# Patient Record
Sex: Female | Born: 1990 | Race: White | Hispanic: No | Marital: Single | Smoking: Current every day smoker
Health system: Southern US, Community
[De-identification: ages and names within clinical notes are randomized; demographics above are authoritative.]

## PROBLEM LIST (undated history)

## (undated) DIAGNOSIS — F419 Anxiety disorder, unspecified: Secondary | ICD-10-CM

## (undated) DIAGNOSIS — F329 Major depressive disorder, single episode, unspecified: Secondary | ICD-10-CM

## (undated) DIAGNOSIS — F32A Depression, unspecified: Secondary | ICD-10-CM

## (undated) DIAGNOSIS — F319 Bipolar disorder, unspecified: Secondary | ICD-10-CM

## (undated) DIAGNOSIS — K5090 Crohn's disease, unspecified, without complications: Secondary | ICD-10-CM

---

## 2013-02-28 ENCOUNTER — Encounter (HOSPITAL_COMMUNITY): Payer: Self-pay | Admitting: Emergency Medicine

## 2013-02-28 ENCOUNTER — Inpatient Hospital Stay (HOSPITAL_COMMUNITY)
Admission: EM | Admit: 2013-02-28 | Discharge: 2013-03-05 | DRG: 580 | Disposition: A | Discharge status: Other Acute Inpatient Hospital | Payer: PRIVATE HEALTH INSURANCE | Attending: General Surgery | Admitting: General Surgery

## 2013-02-28 ENCOUNTER — Emergency Department (HOSPITAL_COMMUNITY): Payer: PRIVATE HEALTH INSURANCE

## 2013-02-28 DIAGNOSIS — X789XXA Intentional self-harm by unspecified sharp object, initial encounter: Secondary | ICD-10-CM | POA: Diagnosis present

## 2013-02-28 DIAGNOSIS — F1210 Cannabis abuse, uncomplicated: Secondary | ICD-10-CM | POA: Diagnosis present

## 2013-02-28 DIAGNOSIS — Y849 Medical procedure, unspecified as the cause of abnormal reaction of the patient, or of later complication, without mention of misadventure at the time of the procedure: Secondary | ICD-10-CM | POA: Diagnosis not present

## 2013-02-28 DIAGNOSIS — K560 Paralytic ileus: Secondary | ICD-10-CM | POA: Diagnosis not present

## 2013-02-28 DIAGNOSIS — R339 Retention of urine, unspecified: Secondary | ICD-10-CM | POA: Diagnosis not present

## 2013-02-28 DIAGNOSIS — F332 Major depressive disorder, recurrent severe without psychotic features: Secondary | ICD-10-CM | POA: Diagnosis present

## 2013-02-28 DIAGNOSIS — S31109A Unspecified open wound of abdominal wall, unspecified quadrant without penetration into peritoneal cavity, initial encounter: Principal | ICD-10-CM | POA: Diagnosis present

## 2013-02-28 DIAGNOSIS — F17200 Nicotine dependence, unspecified, uncomplicated: Secondary | ICD-10-CM | POA: Diagnosis present

## 2013-02-28 DIAGNOSIS — F319 Bipolar disorder, unspecified: Secondary | ICD-10-CM

## 2013-02-28 DIAGNOSIS — F411 Generalized anxiety disorder: Secondary | ICD-10-CM | POA: Diagnosis present

## 2013-02-28 DIAGNOSIS — N390 Urinary tract infection, site not specified: Secondary | ICD-10-CM | POA: Diagnosis not present

## 2013-02-28 DIAGNOSIS — S31119A Laceration without foreign body of abdominal wall, unspecified quadrant without penetration into peritoneal cavity, initial encounter: Secondary | ICD-10-CM

## 2013-02-28 DIAGNOSIS — K5090 Crohn's disease, unspecified, without complications: Secondary | ICD-10-CM | POA: Diagnosis present

## 2013-02-28 DIAGNOSIS — Z7289 Other problems related to lifestyle: Secondary | ICD-10-CM

## 2013-02-28 DIAGNOSIS — D62 Acute posthemorrhagic anemia: Secondary | ICD-10-CM | POA: Diagnosis not present

## 2013-02-28 DIAGNOSIS — K929 Disease of digestive system, unspecified: Secondary | ICD-10-CM | POA: Diagnosis not present

## 2013-02-28 DIAGNOSIS — IMO0002 Reserved for concepts with insufficient information to code with codable children: Secondary | ICD-10-CM

## 2013-02-28 DIAGNOSIS — K469 Unspecified abdominal hernia without obstruction or gangrene: Secondary | ICD-10-CM | POA: Diagnosis present

## 2013-02-28 DIAGNOSIS — R45851 Suicidal ideations: Secondary | ICD-10-CM

## 2013-02-28 HISTORY — DX: Bipolar disorder, unspecified: F31.9

## 2013-02-28 HISTORY — DX: Crohn's disease, unspecified, without complications: K50.90

## 2013-02-28 HISTORY — DX: Depression, unspecified: F32.A

## 2013-02-28 HISTORY — DX: Anxiety disorder, unspecified: F41.9

## 2013-02-28 HISTORY — DX: Major depressive disorder, single episode, unspecified: F32.9

## 2013-02-28 MED ORDER — ONDANSETRON HCL 4 MG/2ML IJ SOLN
INTRAMUSCULAR | Status: AC
  Filled 2013-02-28: qty 2

## 2013-02-28 MED ORDER — ROCURONIUM BROMIDE 50 MG/5ML IV SOLN
INTRAVENOUS | Status: AC
  Filled 2013-02-28: qty 1

## 2013-02-28 MED ORDER — GLYCOPYRROLATE 0.2 MG/ML IJ SOLN
INTRAMUSCULAR | Status: AC
Start: 2013-02-28 — End: 2013-02-28
  Filled 2013-02-28: qty 2

## 2013-02-28 MED ORDER — PROPOFOL 10 MG/ML IV BOLUS
INTRAVENOUS | Status: AC
  Filled 2013-02-28: qty 20

## 2013-02-28 MED ORDER — PHENYLEPHRINE 40 MCG/ML (10ML) SYRINGE FOR IV PUSH (FOR BLOOD PRESSURE SUPPORT)
PREFILLED_SYRINGE | INTRAVENOUS | Status: AC
  Filled 2013-02-28: qty 10

## 2013-02-28 MED ORDER — LIDOCAINE HCL (CARDIAC) 20 MG/ML IV SOLN
INTRAVENOUS | Status: AC
  Filled 2013-02-28: qty 5

## 2013-02-28 MED ORDER — MIDAZOLAM HCL 2 MG/2ML IJ SOLN
INTRAMUSCULAR | Status: AC
  Filled 2013-02-28: qty 2

## 2013-02-28 MED ORDER — CEFAZOLIN SODIUM-DEXTROSE 2-3 GM-% IV SOLR
INTRAVENOUS | Status: AC
  Filled 2013-02-28: qty 50

## 2013-02-28 MED ORDER — CEFAZOLIN SODIUM-DEXTROSE 2-3 GM-% IV SOLR: 2.0000 g | Freq: Once | INTRAVENOUS | Status: DC

## 2013-02-28 MED ORDER — TETANUS-DIPHTHERIA TOXOIDS TD 5-2 LFU IM INJ
0.5000 mL | INJECTION | Freq: Once | INTRAMUSCULAR | Status: AC
  Administered 2013-02-28: 0.5 mL via INTRAMUSCULAR

## 2013-02-28 MED ORDER — FENTANYL CITRATE 0.05 MG/ML IJ SOLN
INTRAMUSCULAR | Status: AC
  Filled 2013-02-28: qty 5

## 2013-02-28 MED ORDER — SUCCINYLCHOLINE CHLORIDE 20 MG/ML IJ SOLN
INTRAMUSCULAR | Status: AC
Start: 2013-02-28 — End: 2013-02-28
  Filled 2013-02-28: qty 1

## 2013-02-28 MED ORDER — NEOSTIGMINE METHYLSULFATE 1 MG/ML IJ SOLN
INTRAMUSCULAR | Status: AC
  Filled 2013-02-28: qty 10

## 2013-02-28 MED ORDER — PROPOFOL 10 MG/ML IV BOLUS
INTRAVENOUS | Status: AC
Start: 2013-02-28 — End: 2013-02-28
  Filled 2013-02-28: qty 20

## 2013-02-28 NOTE — ED Notes (Signed)
See Trauma Narrator

## 2013-03-01 ENCOUNTER — Emergency Department (HOSPITAL_COMMUNITY): Payer: PRIVATE HEALTH INSURANCE | Admitting: Anesthesiology

## 2013-03-01 ENCOUNTER — Encounter (HOSPITAL_COMMUNITY): Payer: Self-pay | Admitting: Emergency Medicine

## 2013-03-01 ENCOUNTER — Encounter (HOSPITAL_COMMUNITY)
Admission: EM | Disposition: A | Discharge status: Other Acute Inpatient Hospital | Payer: Self-pay | Source: Home / Self Care

## 2013-03-01 ENCOUNTER — Encounter (HOSPITAL_COMMUNITY): Payer: PRIVATE HEALTH INSURANCE | Admitting: Anesthesiology

## 2013-03-01 DIAGNOSIS — R45851 Suicidal ideations: Secondary | ICD-10-CM

## 2013-03-01 DIAGNOSIS — F332 Major depressive disorder, recurrent severe without psychotic features: Secondary | ICD-10-CM

## 2013-03-01 DIAGNOSIS — S31109A Unspecified open wound of abdominal wall, unspecified quadrant without penetration into peritoneal cavity, initial encounter: Secondary | ICD-10-CM

## 2013-03-01 DIAGNOSIS — F319 Bipolar disorder, unspecified: Secondary | ICD-10-CM | POA: Diagnosis present

## 2013-03-01 DIAGNOSIS — D62 Acute posthemorrhagic anemia: Secondary | ICD-10-CM | POA: Diagnosis not present

## 2013-03-01 DIAGNOSIS — F3130 Bipolar disorder, current episode depressed, mild or moderate severity, unspecified: Secondary | ICD-10-CM

## 2013-03-01 DIAGNOSIS — K5090 Crohn's disease, unspecified, without complications: Secondary | ICD-10-CM | POA: Insufficient documentation

## 2013-03-01 DIAGNOSIS — S3981XA Other specified injuries of abdomen, initial encounter: Secondary | ICD-10-CM

## 2013-03-01 DIAGNOSIS — S31119A Laceration without foreign body of abdominal wall, unspecified quadrant without penetration into peritoneal cavity, initial encounter: Secondary | ICD-10-CM | POA: Diagnosis present

## 2013-03-01 HISTORY — PX: LAPAROTOMY: SHX154

## 2013-03-01 LAB — URINE MICROSCOPIC-ADD ON

## 2013-03-01 LAB — URINALYSIS, ROUTINE W REFLEX MICROSCOPIC
Bilirubin Urine: NEGATIVE
GLUCOSE, UA: NEGATIVE mg/dL
Ketones, ur: NEGATIVE mg/dL
LEUKOCYTES UA: NEGATIVE
Nitrite: NEGATIVE
Protein, ur: NEGATIVE mg/dL
Specific Gravity, Urine: 1.029 (ref 1.005–1.030)
Urobilinogen, UA: 0.2 mg/dL (ref 0.0–1.0)
pH: 5.5 (ref 5.0–8.0)

## 2013-03-01 LAB — BASIC METABOLIC PANEL
BUN: 5 mg/dL — ABNORMAL LOW (ref 6–23)
CO2: 22 meq/L (ref 19–32)
Calcium: 8.4 mg/dL (ref 8.4–10.5)
Chloride: 104 mEq/L (ref 96–112)
Creatinine, Ser: 0.72 mg/dL (ref 0.50–1.10)
GFR calc Af Amer: >90 mL/min (ref >90)
GFR calc non Af Amer: >90 mL/min (ref >90)
Glucose, Bld: 115 mg/dL — ABNORMAL HIGH (ref 70–99)
POTASSIUM: 3.8 meq/L (ref 3.7–5.3)
SODIUM: 139 meq/L (ref 137–147)

## 2013-03-01 LAB — COMPREHENSIVE METABOLIC PANEL
ALT: 15 U/L (ref 0–35)
AST: 23 U/L (ref 0–37)
Albumin: 4.3 g/dL (ref 3.5–5.2)
Alkaline Phosphatase: 75 U/L (ref 39–117)
BUN: 4 mg/dL — ABNORMAL LOW (ref 6–23)
CALCIUM: 9.4 mg/dL (ref 8.4–10.5)
CO2: 17 mEq/L — ABNORMAL LOW (ref 19–32)
Chloride: 103 mEq/L (ref 96–112)
Creatinine, Ser: 0.79 mg/dL (ref 0.50–1.10)
GFR calc non Af Amer: >90 mL/min (ref >90)
GLUCOSE: 99 mg/dL (ref 70–99)
Potassium: 3.8 mEq/L (ref 3.7–5.3)
SODIUM: 142 meq/L (ref 137–147)
Total Bilirubin: 0.2 mg/dL — ABNORMAL LOW (ref 0.3–1.2)
Total Protein: 8.1 g/dL (ref 6.0–8.3)

## 2013-03-01 LAB — CBC
HCT: 34.4 % — ABNORMAL LOW (ref 36.0–46.0)
HEMATOCRIT: 42.4 % (ref 36.0–46.0)
Hemoglobin: 11.4 g/dL — ABNORMAL LOW (ref 12.0–15.0)
Hemoglobin: 13.9 g/dL (ref 12.0–15.0)
MCH: 25.9 pg — ABNORMAL LOW (ref 26.0–34.0)
MCH: 26 pg (ref 26.0–34.0)
MCHC: 32.8 g/dL (ref 30.0–36.0)
MCHC: 33.1 g/dL (ref 30.0–36.0)
MCV: 78.4 fL (ref 78.0–100.0)
MCV: 79.1 fL (ref 78.0–100.0)
PLATELETS: 443 10*3/uL — AB (ref 150–400)
Platelets: 570 10*3/uL — ABNORMAL HIGH (ref 150–400)
RBC: 4.39 MIL/uL (ref 3.87–5.11)
RBC: 5.36 MIL/uL — ABNORMAL HIGH (ref 3.87–5.11)
RDW: 15.7 % — ABNORMAL HIGH (ref 11.5–15.5)
RDW: 15.8 % — ABNORMAL HIGH (ref 11.5–15.5)
WBC: 13.4 10*3/uL — AB (ref 4.0–10.5)
WBC: 16.9 10*3/uL — AB (ref 4.0–10.5)

## 2013-03-01 LAB — RAPID URINE DRUG SCREEN, HOSP PERFORMED
AMPHETAMINES: NOT DETECTED
Amphetamines: NOT DETECTED
BARBITURATES: NOT DETECTED
BENZODIAZEPINES: NOT DETECTED
Barbiturates: NOT DETECTED
Benzodiazepines: POSITIVE — AB
COCAINE: NOT DETECTED
Cocaine: NOT DETECTED
Opiates: NOT DETECTED
Opiates: POSITIVE — AB
Tetrahydrocannabinol: NOT DETECTED
Tetrahydrocannabinol: NOT DETECTED

## 2013-03-01 LAB — I-STAT CHEM 8, ED
Calcium, Ion: 1.2 mmol/L (ref 1.12–1.23)
Chloride: 105 mEq/L (ref 96–112)
Creatinine, Ser: 1 mg/dL (ref 0.50–1.10)
Glucose, Bld: 104 mg/dL — ABNORMAL HIGH (ref 70–99)
HCT: 47 % — ABNORMAL HIGH (ref 36.0–46.0)
Hemoglobin: 16 g/dL — ABNORMAL HIGH (ref 12.0–15.0)
POTASSIUM: 3.5 meq/L — AB (ref 3.7–5.3)
Sodium: 141 mEq/L (ref 137–147)
TCO2: 19 mmol/L (ref 0–100)

## 2013-03-01 LAB — TYPE AND SCREEN
ABO/RH(D): O POS
ANTIBODY SCREEN: NEGATIVE
UNIT DIVISION: 0
UNIT DIVISION: 0

## 2013-03-01 LAB — PREPARE FRESH FROZEN PLASMA: Unit division: 0

## 2013-03-01 LAB — ABO/RH: ABO/RH(D): O POS

## 2013-03-01 LAB — BLOOD PRODUCT ORDER (VERBAL) VERIFICATION

## 2013-03-01 LAB — PROTIME-INR
INR: 0.95 (ref 0.00–1.49)
Prothrombin Time: 12.5 seconds (ref 11.6–15.2)

## 2013-03-01 LAB — I-STAT CG4 LACTIC ACID, ED: LACTIC ACID, VENOUS: 4.51 mmol/L — AB (ref 0.5–2.2)

## 2013-03-01 LAB — PREGNANCY, URINE
Preg Test, Ur: NEGATIVE
Preg Test, Ur: NEGATIVE

## 2013-03-01 SURGERY — LAPAROTOMY, EXPLORATORY
Anesthesia: General | Site: Abdomen

## 2013-03-01 MED ORDER — OXYCODONE HCL 5 MG PO TABS: 5.0000 mg | ORAL_TABLET | Freq: Once | ORAL | Status: DC | PRN

## 2013-03-01 MED ORDER — POLYETHYLENE GLYCOL 3350 17 G PO PACK
17.0000 g | PACK | Freq: Every day | ORAL | Status: DC
  Administered 2013-03-02 – 2013-03-05 (×4): 17 g via ORAL
  Filled 2013-03-01 (×5): qty 1

## 2013-03-01 MED ORDER — HYDROMORPHONE HCL PF 1 MG/ML IJ SOLN
INTRAMUSCULAR | Status: AC
  Filled 2013-03-01: qty 1

## 2013-03-01 MED ORDER — ONDANSETRON HCL 4 MG/2ML IJ SOLN: 4.0000 mg | Freq: Four times a day (QID) | INTRAMUSCULAR | Status: DC | PRN

## 2013-03-01 MED ORDER — 0.9 % SODIUM CHLORIDE (POUR BTL) OPTIME
TOPICAL | Status: DC | PRN
  Administered 2013-03-01: 2000 mL

## 2013-03-01 MED ORDER — LACTATED RINGERS IV SOLN
INTRAVENOUS | Status: DC | PRN
  Administered 2013-03-01 (×2): via INTRAVENOUS

## 2013-03-01 MED ORDER — DOCUSATE SODIUM 100 MG PO CAPS
100.0000 mg | ORAL_CAPSULE | Freq: Two times a day (BID) | ORAL | Status: DC
Start: 2013-03-01 — End: 2013-03-05
  Administered 2013-03-01 – 2013-03-05 (×8): 100 mg via ORAL
  Filled 2013-03-01 (×11): qty 1

## 2013-03-01 MED ORDER — MIDAZOLAM HCL 5 MG/5ML IJ SOLN
INTRAMUSCULAR | Status: DC | PRN
  Administered 2013-03-01: 2 mg via INTRAVENOUS

## 2013-03-01 MED ORDER — HYDROMORPHONE 0.3 MG/ML IV SOLN
INTRAVENOUS | Status: DC
  Administered 2013-03-01: 3.9 mg via INTRAVENOUS
  Administered 2013-03-01: 10:00:00 via INTRAVENOUS
  Administered 2013-03-01: 1.4 mg via INTRAVENOUS
  Administered 2013-03-01: 1.5 mg via INTRAVENOUS
  Administered 2013-03-01: 21:00:00 via INTRAVENOUS
  Administered 2013-03-02: 4.97 mg via INTRAVENOUS
  Administered 2013-03-02: 07:00:00 via INTRAVENOUS
  Filled 2013-03-01 (×3): qty 25

## 2013-03-01 MED ORDER — HYDROMORPHONE HCL PF 1 MG/ML IJ SOLN
0.2500 mg | INTRAMUSCULAR | Status: DC | PRN
  Administered 2013-03-01 (×4): 0.5 mg via INTRAVENOUS

## 2013-03-01 MED ORDER — MORPHINE SULFATE (PF) 1 MG/ML IV SOLN
INTRAVENOUS | Status: DC
  Administered 2013-03-01: 28.5 mg via INTRAVENOUS
  Administered 2013-03-01 (×2): via INTRAVENOUS
  Filled 2013-03-01 (×2): qty 25

## 2013-03-01 MED ORDER — ONDANSETRON HCL 4 MG/2ML IJ SOLN
INTRAMUSCULAR | Status: DC | PRN
  Administered 2013-03-01: 4 mg via INTRAVENOUS

## 2013-03-01 MED ORDER — LIDOCAINE HCL (CARDIAC) 20 MG/ML IV SOLN
INTRAVENOUS | Status: DC | PRN
  Administered 2013-03-01: 100 mg via INTRAVENOUS

## 2013-03-01 MED ORDER — HEPARIN SODIUM (PORCINE) 5000 UNIT/ML IJ SOLN
5000.0000 [IU] | Freq: Three times a day (TID) | INTRAMUSCULAR | Status: DC
  Filled 2013-03-01 (×4): qty 1

## 2013-03-01 MED ORDER — PANTOPRAZOLE SODIUM 40 MG PO TBEC
40.0000 mg | DELAYED_RELEASE_TABLET | Freq: Every day | ORAL | Status: DC
  Administered 2013-03-01 – 2013-03-02 (×2): 40 mg via ORAL
  Filled 2013-03-01 (×2): qty 1

## 2013-03-01 MED ORDER — SUCCINYLCHOLINE CHLORIDE 20 MG/ML IJ SOLN
INTRAMUSCULAR | Status: DC | PRN
  Administered 2013-03-01: 140 mg via INTRAVENOUS

## 2013-03-01 MED ORDER — ONDANSETRON HCL 4 MG/2ML IJ SOLN: 4.0000 mg | Freq: Once | INTRAMUSCULAR | Status: DC | PRN

## 2013-03-01 MED ORDER — LACTATED RINGERS IV SOLN
INTRAVENOUS | Status: DC | PRN
  Administered 2013-02-28: 125 mL/h via INTRAVENOUS

## 2013-03-01 MED ORDER — PROPOFOL 10 MG/ML IV BOLUS
INTRAVENOUS | Status: DC | PRN
  Administered 2013-03-01: 150 mg via INTRAVENOUS

## 2013-03-01 MED ORDER — NAPROXEN 500 MG PO TABS
500.0000 mg | ORAL_TABLET | Freq: Two times a day (BID) | ORAL | Status: DC
  Administered 2013-03-01 – 2013-03-04 (×6): 500 mg via ORAL
  Filled 2013-03-01 (×11): qty 1

## 2013-03-01 MED ORDER — KCL IN DEXTROSE-NACL 20-5-0.45 MEQ/L-%-% IV SOLN
INTRAVENOUS | Status: DC
  Administered 2013-03-01 – 2013-03-02 (×2): via INTRAVENOUS
  Filled 2013-03-01 (×3): qty 1000

## 2013-03-01 MED ORDER — ONDANSETRON HCL 4 MG PO TABS: 4.0000 mg | ORAL_TABLET | Freq: Four times a day (QID) | ORAL | Status: DC | PRN

## 2013-03-01 MED ORDER — PANTOPRAZOLE SODIUM 40 MG IV SOLR
40.0000 mg | Freq: Every day | INTRAVENOUS | Status: DC
  Filled 2013-03-01: qty 40

## 2013-03-01 MED ORDER — DIPHENHYDRAMINE HCL 50 MG/ML IJ SOLN
12.5000 mg | Freq: Four times a day (QID) | INTRAMUSCULAR | Status: DC | PRN
  Administered 2013-03-01 – 2013-03-02 (×3): 12.5 mg via INTRAVENOUS
  Filled 2013-03-01 (×5): qty 1

## 2013-03-01 MED ORDER — MEPERIDINE HCL 25 MG/ML IJ SOLN: 6.2500 mg | INTRAMUSCULAR | Status: DC | PRN

## 2013-03-01 MED ORDER — SODIUM CHLORIDE 0.9 % IV SOLN
2.0000 mg | INTRAVENOUS | Status: DC | PRN
  Administered 2013-03-01: 2 mg via INTRAVENOUS

## 2013-03-01 MED ORDER — FENTANYL CITRATE 0.05 MG/ML IJ SOLN
INTRAMUSCULAR | Status: DC | PRN
  Administered 2013-03-01 (×2): 50 ug via INTRAVENOUS
  Administered 2013-03-01: 100 ug via INTRAVENOUS
  Administered 2013-03-01: 50 ug via INTRAVENOUS

## 2013-03-01 MED ORDER — DIPHENHYDRAMINE HCL 12.5 MG/5ML PO ELIX
12.5000 mg | ORAL_SOLUTION | Freq: Four times a day (QID) | ORAL | Status: DC | PRN
  Filled 2013-03-01: qty 5

## 2013-03-01 MED ORDER — ENOXAPARIN SODIUM 40 MG/0.4ML ~~LOC~~ SOLN
40.0000 mg | SUBCUTANEOUS | Status: DC
  Administered 2013-03-03 – 2013-03-05 (×3): 40 mg via SUBCUTANEOUS
  Filled 2013-03-01 (×6): qty 0.4

## 2013-03-01 MED ORDER — OXYCODONE HCL 5 MG/5ML PO SOLN: 5.0000 mg | Freq: Once | ORAL | Status: DC | PRN

## 2013-03-01 MED ORDER — NALOXONE HCL 0.4 MG/ML IJ SOLN: 0.4000 mg | INTRAMUSCULAR | Status: DC | PRN

## 2013-03-01 MED ORDER — SODIUM CHLORIDE 0.9 % IJ SOLN: 9.0000 mL | INTRAMUSCULAR | Status: DC | PRN

## 2013-03-01 SURGICAL SUPPLY — 52 items
BLADE SURG ROTATE 9660 (MISCELLANEOUS) IMPLANT
CANISTER SUCTION 2500CC (MISCELLANEOUS) ×3 IMPLANT
CHLORAPREP W/TINT 26ML (MISCELLANEOUS) ×3 IMPLANT
COVER MAYO STAND STRL (DRAPES) ×3 IMPLANT
COVER SURGICAL LIGHT HANDLE (MISCELLANEOUS) ×3 IMPLANT
DRAPE LAPAROSCOPIC ABDOMINAL (DRAPES) ×3 IMPLANT
DRAPE PROXIMA HALF (DRAPES) IMPLANT
DRAPE UTILITY 15X26 W/TAPE STR (DRAPE) ×6 IMPLANT
DRAPE WARM FLUID 44X44 (DRAPE) ×6 IMPLANT
DRSG COVADERM 4X10 (GAUZE/BANDAGES/DRESSINGS) ×3 IMPLANT
DRSG COVADERM 4X8 (GAUZE/BANDAGES/DRESSINGS) ×3 IMPLANT
DRSG OPSITE POSTOP 4X10 (GAUZE/BANDAGES/DRESSINGS) IMPLANT
DRSG OPSITE POSTOP 4X8 (GAUZE/BANDAGES/DRESSINGS) IMPLANT
ELECT BLADE 6.5 EXT (BLADE) IMPLANT
ELECT CAUTERY BLADE 6.4 (BLADE) ×3 IMPLANT
ELECT REM PT RETURN 9FT ADLT (ELECTROSURGICAL) ×3
ELECTRODE REM PT RTRN 9FT ADLT (ELECTROSURGICAL) ×1 IMPLANT
GLOVE BIO SURGEON STRL SZ 6 (GLOVE) ×6 IMPLANT
GLOVE BIOGEL PI IND STRL 6.5 (GLOVE) ×2 IMPLANT
GLOVE BIOGEL PI IND STRL 7.5 (GLOVE) ×2 IMPLANT
GLOVE BIOGEL PI INDICATOR 6.5 (GLOVE) ×4
GLOVE BIOGEL PI INDICATOR 7.5 (GLOVE) ×4
GLOVE ECLIPSE 7.0 STRL STRAW (GLOVE) ×3 IMPLANT
GLOVE SURG SS PI 7.5 STRL IVOR (GLOVE) ×6 IMPLANT
GOWN STRL NON-REIN LRG LVL3 (GOWN DISPOSABLE) ×6 IMPLANT
GOWN STRL REUS W/TWL 2XL LVL3 (GOWN DISPOSABLE) ×3 IMPLANT
KIT BASIN OR (CUSTOM PROCEDURE TRAY) ×3 IMPLANT
KIT ROOM TURNOVER OR (KITS) ×3 IMPLANT
LIGASURE IMPACT 36 18CM CVD LR (INSTRUMENTS) IMPLANT
NS IRRIG 1000ML POUR BTL (IV SOLUTION) ×6 IMPLANT
PACK GENERAL/GYN (CUSTOM PROCEDURE TRAY) ×3 IMPLANT
PAD ARMBOARD 7.5X6 YLW CONV (MISCELLANEOUS) ×3 IMPLANT
PENCIL BUTTON HOLSTER BLD 10FT (ELECTRODE) IMPLANT
SPECIMEN JAR LARGE (MISCELLANEOUS) IMPLANT
SPONGE LAP 18X18 X RAY DECT (DISPOSABLE) IMPLANT
STAPLER VISISTAT 35W (STAPLE) ×3 IMPLANT
SUCTION POOLE TIP (SUCTIONS) ×3 IMPLANT
SUT PDS AB 1 TP1 96 (SUTURE) ×6 IMPLANT
SUT PDS II 0 TP-1 LOOPED 60 (SUTURE) ×6 IMPLANT
SUT VIC AB 0 CT1 27 (SUTURE) ×2
SUT VIC AB 0 CT1 27XBRD ANBCTR (SUTURE) ×1 IMPLANT
SUT VIC AB 2-0 SH 18 (SUTURE) ×3 IMPLANT
SUT VIC AB 3-0 SH 18 (SUTURE) ×3 IMPLANT
SUT VICRYL 4-0 PS2 18IN ABS (SUTURE) IMPLANT
SUT VICRYL AB 2 0 TIES (SUTURE) ×3 IMPLANT
SUT VICRYL AB 3 0 TIES (SUTURE) ×3 IMPLANT
TOWEL OR 17X24 6PK STRL BLUE (TOWEL DISPOSABLE) ×3 IMPLANT
TOWEL OR 17X26 10 PK STRL BLUE (TOWEL DISPOSABLE) ×3 IMPLANT
TRAY FOLEY CATH 16FRSI W/METER (SET/KITS/TRAYS/PACK) IMPLANT
TUBE CONNECTING 12'X1/4 (SUCTIONS)
TUBE CONNECTING 12X1/4 (SUCTIONS) IMPLANT
YANKAUER SUCT BULB TIP NO VENT (SUCTIONS) IMPLANT
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
## 2013-03-01 NOTE — ED Notes (Signed)
Uneventful transport to Short Stay.
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
## 2013-03-01 NOTE — ED Notes (Signed)
Patient Valuables envelope x 2 in [employer] in the Safe. Valuable envelope numbers are [id number], [id number].  Leretha DykesMelissa Browning, RN and Maryellen PileEd Janaria Mccammon, Charity fundraiserN verified contents along with [employer].

## 2013-03-01 NOTE — Preoperative (Signed)
Beta Blockers   Reason not to administer Beta Blockers:Not Applicable 

## 2013-03-01 NOTE — Progress Notes (Signed)
NURSING PROGRESS NOTE  Isabella Phillips [id number] Admission Data: 03/01/2013 2:55 AM Attending Provider: Trauma Md, MD OTR:[id number] Not In System Code Status: full  Isabella Phillips is a 23 y.o. female patient admitted from ED:  -No acute distress noted.  -No complaints of shortness of breath.  -No complaints of chest pain.    Blood pressure 127/85, pulse 126, temperature 98.2 F (36.8 C), temperature source Oral, resp. rate 22, height 5' 3.5" (1.613 m), weight 58.968 kg (130 lb), SpO2 92.00%.   IV Fluids:  IV in place, occlusive dsg intact without redness, IV cath forearm left, condition patent and no redness lactated Ringer's.   Allergies:  Review of patient's allergies indicates no known allergies.  Past Medical History:   has a past medical history of Bipolar 1 disorder and Crohn's disease.  Past Surgical History:   has no past surgical history on file.  Social History:   reports that she has been smoking.  She does not have any smokeless tobacco history on file. She reports that she drinks alcohol. She reports that she uses illicit drugs (Marijuana).  Skin: WDL  Patient/Family oriented to room. Information packet given to patient/family. Admission inpatient armband information verified with patient/family to include name and date of birth and placed on patient arm. Side rails up x 2, fall assessment and education completed with patient/family. Patient/family able to verbalize understanding of risk associated with falls and verbalized understanding to call for assistance before getting out of bed. Call light within reach. Patient/family able to voice and demonstrate understanding of unit orientation instructions.

## 2013-03-01 NOTE — Progress Notes (Signed)
Pt. Has refused to cough, move in bed or get up and walk in halls.  Pt. Agreed to sit on the side of the bed if she could speak with her fiancee.  RN allowed pt. To talk on the phone with Amber, her fiancee.  Pt. Sat up on side of bed and then wanted to walk.  Pt. Ambulated in hall up to nursing station beside the snack area with oxygen and became dizzy.  She was placed in wheelchair and taken back to room and placed back in bed.  Will continue to monitor.  Forbes Cellarelcine Mika Griffitts, RN
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
## 2013-03-01 NOTE — Progress Notes (Signed)
Pt PCA pump beeping, pt HR in 120-130s while pt is crying about fiance not here yet to see her. RN Educational psychologist in pt room in attempt to calm pt down,  Pt educated on why its needed for her to remain calm, as PCA will not deliver pain meds with her HR elevated. Pt agitated about pump peeping stating that she's hurting and can't stop crying, she states "why isn't Amber here yet?" Pt calms down shortly after, PCA dose given. Pt stated that she doesn't want the PCA pump and that the doctor told her she would be getting pills for her pain. Paged [phone], General surgery pager number. Awaiting callback.

## 2013-03-01 NOTE — Progress Notes (Signed)
Patient ID: Isabella OchoaBrooke Elizabeth Phillips, female   DOB: [birth year], 23 y.o.   MRN: [id number]   LOS: 1 day  POD#0  Subjective: Pain poorly controlled. Denies N/V/flatus.   Objective: Vital signs in last 24 hours: Temp:  [97.6 F (36.4 C)-98.8 F (37.1 C)] 98.4 F (36.9 C) (02/20 0821) Pulse Rate:  [90-126] 113 (02/20 0821) Resp:  [16-34] 21 (02/20 0908) BP: (114-143)/(70-110) 140/93 mmHg (02/20 0821) SpO2:  [92 %-100 %] 96 % (02/20 0908) Weight:  [130 lb (58.968 kg)] 130 lb (58.968 kg) (02/19 2340) Last BM Date:  (pt uncooperative with questions at present)   Laboratory  CBC  Recent Labs  02/28/13 2347 03/01/13 0002 03/01/13 0454  WBC 13.4*  --  16.9*  HGB 13.9 16.0* 11.4*  HCT 42.4 47.0* 34.4*  PLT 570*  --  443*   BMET  Recent Labs  02/28/13 2347 03/01/13 0002 03/01/13 0454  NA 142 141 139  K 3.8 3.5* 3.8  CL 103 105 104  CO2 17*  --  22  GLUCOSE 99 104* 115*  BUN 4* <3* 5*  CREATININE 0.79 1.00 0.72  CALCIUM 9.4  --  8.4    Physical Exam General appearance: alert and no distress Resp: clear to auscultation bilaterally Cardio: regular rate and rhythm GI: Soft, +BS   Assessment/Plan: SISW abdomen S/p negative ex lap -- Advance to fulls ABL anemia -- Check tomorrow Bipolar/SI -- Psych consult FEN -- Decrease IVF, change PCA to dilaudid VTE -- SCD's, change heparin to Lovenox Dispo -- [facility], anticipate ready for discharge over weekend    Freeman CaldronMichael J. Shadavia Dampier, PA-C Pager: [phone] General Trauma PA Pager: [phone]  03/01/2013

## 2013-03-01 NOTE — Anesthesia Preprocedure Evaluation (Addendum)
Anesthesia Evaluation  Patient identified by MRN, date of birth, ID band Patient awake    Reviewed: Allergy & Precautions, H&P , NPO status , Patient's Chart, lab work & pertinent test results  History of Anesthesia Complications Negative for: history of anesthetic complications  Airway Mallampati: I TM Distance: >3 FB Neck ROM: Full    Dental  (+) Teeth Intact, Dental Advisory Given   Pulmonary Current Smoker,          Cardiovascular negative cardio ROS      Neuro/Psych PSYCHIATRIC DISORDERS Bipolar Disorder    GI/Hepatic negative GI ROS, (+)     substance abuse  alcohol use and IV drug use,   Endo/Other  negative endocrine ROS  Renal/GU negative Renal ROS     Musculoskeletal negative musculoskeletal ROS (+)   Abdominal   Peds  Hematology negative hematology ROS (+)   Anesthesia Other Findings   Reproductive/Obstetrics negative OB ROS                        Anesthesia Physical Anesthesia Plan  ASA: III and emergent  Anesthesia Plan: General   Post-op Pain Management:    Induction: Intravenous, Rapid sequence and Cricoid pressure planned  Airway Management Planned: Oral ETT  Additional Equipment:   Intra-op Plan:   Post-operative Plan: Extubation in OR  Informed Consent: I have reviewed the patients History and Physical, chart, labs and discussed the procedure including the risks, benefits and alternatives for the proposed anesthesia with the patient or authorized representative who has indicated his/her understanding and acceptance.   Dental advisory given  Plan Discussed with: CRNA and Surgeon  Anesthesia Plan Comments:        Anesthesia Quick Evaluation

## 2013-03-01 NOTE — Progress Notes (Signed)
Pt. Belongings removed from the safe and given to pt. Mother, Ginger Basinski.  Pt. Mom to take all of pt. Belongings (black & gray jacket, black & gray hat with yellow writing, black belt, black & gray tennis shoes)  home today when she leaves.  Pt. Valuables receipts placed in char.  Will continue to monitor.  Forbes Cellar, RN
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
## 2013-03-01 NOTE — Progress Notes (Signed)
Still no callback from General Surgery. Spoke with pt who stated that she wants to keep PCA pump, HR and respirations controlled . Will continue to monitor pt per MD orders.

## 2013-03-01 NOTE — Progress Notes (Signed)
RN spoke with Dr. Elsie SaasJonnalagadda, who came to do the psychiatric consult on pt.  Dr. Elsie SaasJonnalagadda stated it was ok for pt. Fiancee to visit pt. Just as long as it don't make pt. Agitated. Informed pt. And pt. Mother.  Amber, pt. Fiancee to come up later today and may spend the night.  Will continue to monitor.  Forbes Cellarelcine Carter Kassel, RN

## 2013-03-01 NOTE — Anesthesia Procedure Notes (Signed)
Procedure Name: Intubation Date/Time: 03/01/2013 12:34 AM Performed by: Nicholos Johns Pre-anesthesia Checklist: Patient identified, Timeout performed, Emergency Drugs available, Suction available and Patient being monitored Patient Re-evaluated:Patient Re-evaluated prior to inductionOxygen Delivery Method: Circle system utilized Preoxygenation: Pre-oxygenation with 100% oxygen Intubation Type: IV induction, Rapid sequence and Cricoid Pressure applied Ventilation: Mask ventilation without difficulty Laryngoscope Size: Mac and 3 Grade View: Grade I Tube size: 7.5 mm Number of attempts: 1 Airway Equipment and Method: Stylet Placement Confirmation: ETT inserted through vocal cords under direct vision,  positive ETCO2 and breath sounds checked- equal and bilateral Secured at: 21 cm Tube secured with: Tape Dental Injury: Teeth and Oropharynx as per pre-operative assessment

## 2013-03-01 NOTE — Progress Notes (Signed)
Seen and agree  

## 2013-03-01 NOTE — ED Notes (Signed)
i stat results given to Dr. Norlene Campbell by B. Bing Plume, EMT

## 2013-03-02 DIAGNOSIS — D62 Acute posthemorrhagic anemia: Secondary | ICD-10-CM

## 2013-03-02 LAB — URINE CULTURE
COLONY COUNT: NO GROWTH
Culture: NO GROWTH

## 2013-03-02 LAB — CBC
HCT: 33.4 % — ABNORMAL LOW (ref 36.0–46.0)
Hemoglobin: 10.5 g/dL — ABNORMAL LOW (ref 12.0–15.0)
MCH: 25.4 pg — AB (ref 26.0–34.0)
MCHC: 31.4 g/dL (ref 30.0–36.0)
MCV: 80.9 fL (ref 78.0–100.0)
PLATELETS: 346 10*3/uL (ref 150–400)
RBC: 4.13 MIL/uL (ref 3.87–5.11)
RDW: 15.4 % (ref 11.5–15.5)
WBC: 9.8 10*3/uL (ref 4.0–10.5)

## 2013-03-02 LAB — CDS SEROLOGY

## 2013-03-02 MED ORDER — DIPHENHYDRAMINE HCL 25 MG PO CAPS
25.0000 mg | ORAL_CAPSULE | Freq: Four times a day (QID) | ORAL | Status: DC | PRN
  Administered 2013-03-02: 25 mg via ORAL
  Filled 2013-03-02: qty 1

## 2013-03-02 MED ORDER — OXYCODONE HCL 5 MG PO TABS
10.0000 mg | ORAL_TABLET | ORAL | Status: DC | PRN
  Administered 2013-03-02: 20 mg via ORAL
  Administered 2013-03-02 – 2013-03-03 (×3): 10 mg via ORAL
  Filled 2013-03-02: qty 4
  Filled 2013-03-02: qty 2
  Filled 2013-03-02: qty 3
  Filled 2013-03-02: qty 2

## 2013-03-02 MED ORDER — HYDROMORPHONE HCL PF 1 MG/ML IJ SOLN
1.0000 mg | INTRAMUSCULAR | Status: DC | PRN
  Administered 2013-03-02 (×2): 1 mg via INTRAVENOUS
  Filled 2013-03-02 (×2): qty 1

## 2013-03-02 MED ORDER — BACITRACIN ZINC 500 UNIT/GM EX OINT
1.0000 "application " | TOPICAL_OINTMENT | Freq: Two times a day (BID) | CUTANEOUS | Status: DC
  Administered 2013-03-02 – 2013-03-05 (×7): 1 via TOPICAL
  Filled 2013-03-02: qty 28.35

## 2013-03-02 NOTE — Progress Notes (Signed)
Patient ID: Isabella OchoaBrooke Elizabeth XXXFine, female   DOB: 1991/01/05, 23 y.o.   MRN: [id number]   LOS: 2 days  POD#1  Subjective: Denies N/V. Still having quite a bit of pain but diluadid PCA working better than morphine did yesterday. Labile. Still expressing suicidal ideation.  Objective: Vital signs in last 24 hours: Temp:  [97.9 F (36.6 C)-98.5 F (36.9 C)] 97.9 F (36.6 C) (02/21 1021) Pulse Rate:  [94-139] 100 (02/21 1021) Resp:  [15-24] 18 (02/21 1021) BP: (99-116)/(61-85) 99/61 mmHg (02/21 1021) SpO2:  [2 %-98 %] 98 % (02/21 1021) Last BM Date:  (pt uncooperative)   Laboratory  CBC  Recent Labs  03/01/13 0454 03/02/13 0505  WBC 16.9* 9.8  HGB 11.4* 10.5*  HCT 34.4* 33.4*  PLT 443* 346    Physical Exam General appearance: alert and no distress Resp: clear to auscultation bilaterally Cardio: regular rate and rhythm GI: Soft, +BS, incision/wound C/D/I   Assessment/Plan: SISW abdomen  S/p negative ex lap -- Advance to regular ABL anemia -- Continues to drift, likely equilibration Bipolar/SI -- Psych consult  FEN -- Orals for pain VTE -- SCD's, Lovenox  [facility], anticipate ready for discharge over weekend    Freeman CaldronMichael J. Belisa Eichholz, PA-C Pager: [phone] General Trauma PA Pager: [phone]  03/02/2013

## 2013-03-02 NOTE — Progress Notes (Signed)
Await placement. Surgically stable.

## 2013-03-02 NOTE — Progress Notes (Signed)
Replaced dilaudid in PCA pump, wasted 93mL w/ 2RNs, Nelda Marseille, RN

## 2013-03-03 ENCOUNTER — Encounter (HOSPITAL_COMMUNITY): Payer: Self-pay | Admitting: Psychiatry

## 2013-03-03 DIAGNOSIS — R339 Retention of urine, unspecified: Secondary | ICD-10-CM

## 2013-03-03 DIAGNOSIS — S31109A Unspecified open wound of abdominal wall, unspecified quadrant without penetration into peritoneal cavity, initial encounter: Principal | ICD-10-CM

## 2013-03-03 DIAGNOSIS — F319 Bipolar disorder, unspecified: Secondary | ICD-10-CM

## 2013-03-03 DIAGNOSIS — F411 Generalized anxiety disorder: Secondary | ICD-10-CM

## 2013-03-03 DIAGNOSIS — N390 Urinary tract infection, site not specified: Secondary | ICD-10-CM | POA: Diagnosis not present

## 2013-03-03 DIAGNOSIS — R45851 Suicidal ideations: Secondary | ICD-10-CM

## 2013-03-03 LAB — URINALYSIS, ROUTINE W REFLEX MICROSCOPIC
Glucose, UA: NEGATIVE mg/dL
Ketones, ur: 40 mg/dL — AB
NITRITE: POSITIVE — AB
Specific Gravity, Urine: 1.027 (ref 1.005–1.030)
Urobilinogen, UA: 1 mg/dL (ref 0.0–1.0)
pH: 5 (ref 5.0–8.0)

## 2013-03-03 LAB — CBC
HCT: 32.9 % — ABNORMAL LOW (ref 36.0–46.0)
Hemoglobin: 10.8 g/dL — ABNORMAL LOW (ref 12.0–15.0)
MCH: 26.2 pg (ref 26.0–34.0)
MCHC: 32.8 g/dL (ref 30.0–36.0)
MCV: 79.9 fL (ref 78.0–100.0)
PLATELETS: 375 10*3/uL (ref 150–400)
RBC: 4.12 MIL/uL (ref 3.87–5.11)
RDW: 15.3 % (ref 11.5–15.5)
WBC: 7.7 10*3/uL (ref 4.0–10.5)

## 2013-03-03 LAB — URINE MICROSCOPIC-ADD ON

## 2013-03-03 MED ORDER — BETHANECHOL CHLORIDE 25 MG PO TABS
25.0000 mg | ORAL_TABLET | Freq: Four times a day (QID) | ORAL | Status: DC
  Filled 2013-03-03 (×4): qty 1

## 2013-03-03 MED ORDER — SULFAMETHOXAZOLE-TMP DS 800-160 MG PO TABS
1.0000 | ORAL_TABLET | Freq: Two times a day (BID) | ORAL | Status: DC
  Administered 2013-03-03 – 2013-03-05 (×5): 1 via ORAL
  Filled 2013-03-03 (×7): qty 1

## 2013-03-03 MED ORDER — OXYCODONE HCL 5 MG PO TABS
5.0000 mg | ORAL_TABLET | ORAL | Status: DC | PRN
  Filled 2013-03-03: qty 2
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
## 2013-03-03 NOTE — Progress Notes (Signed)
Pt's fiance lying in bed with pt. Both pt and fiance informed that it against hospital policy for anyone to lie in bed with the pt. Pt's and fiance agreeable, fiance now in recliner next to pt bed, will continue to monitor.
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
## 2013-03-03 NOTE — Progress Notes (Signed)
Seen and agree,  Tolerating diet. Ambulating in halls. Abdomen soft and wound clean Stable  Isabella MouldHaywood M. Derrell LollingIngram, M.D., [facility] General and Minimally invasive Surgery Breast and Colorectal Surgery Trauma Office:   [phone]

## 2013-03-04 ENCOUNTER — Encounter (HOSPITAL_COMMUNITY): Payer: Self-pay | Admitting: General Surgery

## 2013-03-04 MED ORDER — TRAMADOL HCL 50 MG PO TABS: 50.0000 mg | ORAL_TABLET | Freq: Four times a day (QID) | ORAL | Status: DC | PRN

## 2013-03-04 NOTE — Clinical Social Work Psych Note (Signed)
Pt was referred to [facility] for review for possible admission.  Psych CSW will continue to assist with disposition.  Vickii Penna, LCSWA [phone]  Clinical Social Work

## 2013-03-04 NOTE — Progress Notes (Signed)
Patient ID: Isabella Phillips, female   DOB: [birth year], 23 y.o.   MRN: [id number]   LOS: 4 days   Subjective: No issues  Objective: Vital signs in last 24 hours: Temp:  [97.9 F (36.6 C)-99 F (37.2 C)] 97.9 F (36.6 C) (02/23 0518) Pulse Rate:  [84-105] 92 (02/23 0518) Resp:  [16-18] 16 (02/23 0518) BP: (100-127)/(66-81) 100/66 mmHg (02/23 0518) SpO2:  [93 %-98 %] 94 % (02/23 0518) Last BM Date: 02/28/13   Physical Exam General appearance: alert and no distress Resp: clear to auscultation bilaterally Cardio: regular rate and rhythm GI: Soft, +BS, incision C/D/I   Assessment/Plan: SISW abdomen  S/p negative ex lap  ABL anemia -- Stable  UTI -- Septra D2/3 Bipolar/SI -- Psych consult  FEN -- No issues VTE -- SCD's, Lovenox  Dispo -- Transfer to [facility] when bed available, ready for d/c from trauma standpoint    Freeman Caldron, PA-C Pager: [phone] General Trauma PA Pager: [phone]  03/04/2013

## 2013-03-04 NOTE — Progress Notes (Signed)
Patient stable.  Hemoglobin stable.  Okay for the patient to go to behavioral health anytime.  Staple wil need to be removed in 7-9 days.  This patient has been seen and I agree with the findings and treatment plan.  Marta Lamas. Gae Bon, MD, FACS [phone] (pager) [phone] (direct pager) Trauma Surgeon

## 2013-03-05 ENCOUNTER — Inpatient Hospital Stay (HOSPITAL_COMMUNITY)
Admission: AD | Admit: 2013-03-05 | Discharge: 2013-03-08 | DRG: 897 | Disposition: A | Discharge status: Home / Self Care | Payer: PRIVATE HEALTH INSURANCE | Source: Intra-hospital | Attending: Psychiatry | Admitting: Psychiatry

## 2013-03-05 ENCOUNTER — Encounter (HOSPITAL_COMMUNITY): Payer: Self-pay

## 2013-03-05 DIAGNOSIS — F17200 Nicotine dependence, unspecified, uncomplicated: Secondary | ICD-10-CM | POA: Diagnosis present

## 2013-03-05 DIAGNOSIS — F1120 Opioid dependence, uncomplicated: Secondary | ICD-10-CM | POA: Diagnosis present

## 2013-03-05 DIAGNOSIS — F1994 Other psychoactive substance use, unspecified with psychoactive substance-induced mood disorder: Secondary | ICD-10-CM | POA: Diagnosis present

## 2013-03-05 DIAGNOSIS — G4700 Insomnia, unspecified: Secondary | ICD-10-CM | POA: Diagnosis present

## 2013-03-05 MED ORDER — NAPROXEN 500 MG PO TABS: 500.0000 mg | ORAL_TABLET | Freq: Two times a day (BID) | ORAL | Status: DC

## 2013-03-05 MED ORDER — MAGNESIUM HYDROXIDE 400 MG/5ML PO SUSP: 30.0000 mL | Freq: Every day | ORAL | Status: DC | PRN

## 2013-03-05 MED ORDER — DSS 100 MG PO CAPS: 100.0000 mg | ORAL_CAPSULE | Freq: Two times a day (BID) | ORAL | Status: DC

## 2013-03-05 MED ORDER — BISACODYL 10 MG RE SUPP: 10.0000 mg | Freq: Every day | RECTAL | Status: DC | PRN

## 2013-03-05 MED ORDER — FLUOXETINE HCL 20 MG PO CAPS
20.0000 mg | ORAL_CAPSULE | Freq: Two times a day (BID) | ORAL | Status: AC
  Administered 2013-03-06 (×2): 20 mg via ORAL
  Filled 2013-03-05 (×6): qty 1

## 2013-03-05 MED ORDER — NAPROXEN 500 MG PO TABS
500.0000 mg | ORAL_TABLET | Freq: Two times a day (BID) | ORAL | Status: DC
  Administered 2013-03-06 – 2013-03-08 (×5): 500 mg via ORAL
  Filled 2013-03-05 (×8): qty 1

## 2013-03-05 MED ORDER — HYDROXYZINE HCL 25 MG PO TABS
25.0000 mg | ORAL_TABLET | Freq: Four times a day (QID) | ORAL | Status: DC | PRN
  Filled 2013-03-05: qty 12

## 2013-03-05 MED ORDER — TRAMADOL HCL 50 MG PO TABS: 50.0000 mg | ORAL_TABLET | Freq: Four times a day (QID) | ORAL | Status: DC | PRN

## 2013-03-05 MED ORDER — DOCUSATE SODIUM 100 MG PO CAPS
100.0000 mg | ORAL_CAPSULE | Freq: Two times a day (BID) | ORAL | Status: DC
  Administered 2013-03-06 (×2): 100 mg via ORAL
  Filled 2013-03-05 (×8): qty 1

## 2013-03-05 MED ORDER — BUSPIRONE HCL 10 MG PO TABS: 10.0000 mg | ORAL_TABLET | Freq: Three times a day (TID) | ORAL | Status: DC | PRN

## 2013-03-05 MED ORDER — FLEET ENEMA 7-19 GM/118ML RE ENEM
1.0000 | ENEMA | Freq: Once | RECTAL | Status: DC
  Filled 2013-03-05: qty 1

## 2013-03-05 MED ORDER — ALUM & MAG HYDROXIDE-SIMETH 200-200-20 MG/5ML PO SUSP: 30.0000 mL | ORAL | Status: DC | PRN

## 2013-03-05 MED ORDER — TRAZODONE HCL 50 MG PO TABS
50.0000 mg | ORAL_TABLET | Freq: Every evening | ORAL | Status: DC | PRN
Start: 2013-03-05 — End: 2013-03-08
  Filled 2013-03-05: qty 1
  Filled 2013-03-05: qty 8
  Filled 2013-03-05 (×4): qty 1
  Filled 2013-03-05: qty 8
  Filled 2013-03-05 (×3): qty 1

## 2013-03-05 MED ORDER — POLYETHYLENE GLYCOL 3350 17 G PO PACK
17.0000 g | PACK | Freq: Every day | ORAL | Status: DC
  Administered 2013-03-06: 17 g via ORAL
  Filled 2013-03-05 (×5): qty 1

## 2013-03-05 MED ORDER — ACETAMINOPHEN 325 MG PO TABS: 650.0000 mg | ORAL_TABLET | Freq: Four times a day (QID) | ORAL | Status: DC | PRN

## 2013-03-05 MED ORDER — POLYETHYLENE GLYCOL 3350 17 G PO PACK: 17.0000 g | PACK | Freq: Every day | ORAL | Status: DC

## 2013-03-05 MED ORDER — FLEET ENEMA 7-19 GM/118ML RE ENEM: 1.0000 | ENEMA | Freq: Every day | RECTAL | Status: DC | PRN

## 2013-03-05 MED ORDER — NICOTINE 14 MG/24HR TD PT24
14.0000 mg | MEDICATED_PATCH | Freq: Every day | TRANSDERMAL | Status: DC
  Filled 2013-03-05 (×6): qty 1
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
## 2013-03-05 NOTE — Discharge Summary (Signed)
Is[facility] Phillips [facility] per [facility] order.  Discussed with the patient and all questions fully answered.    Medication List         bisacodyl 10 MG suppository  Commonly known as:  DULCOLAX  Place 1 suppository (10 mg total) rectally daily as needed for mild constipation or moderate constipation.     busPIRone 10 MG tablet  Commonly known as:  BUSPAR  Take 10 mg by mouth 3 (three) times daily as needed (anxiety).     DSS 100 MG Caps  Take 100 mg by mouth 2 (two) times daily.     FLUoxetine 10 MG capsule  Commonly known as:  PROZAC  Take 20 mg by mouth 2 (two) times daily.     naproxen 500 MG tablet  Commonly known as:  NAPROSYN  Take 1 tablet (500 mg total) by mouth 2 (two) times daily with a meal.     polyethylene glycol packet  Commonly known as:  MIRALAX / GLYCOLAX  Take 17 g by mouth daily.     sodium phosphate 7-19 GM/118ML Enem  Place 1 enema rectally daily as needed for severe constipation.     traMADol 50 MG tablet  Commonly known as:  ULTRAM  Take 1-2 tablets (50-100 mg total) by mouth every 6 (six) hours as needed (50mg  for mild pain, 75mg  for moderate pain, 100mg  for severe pain).        VVS, Skin clean, dry and intact without evidence of skin break down, no evidence of skin tears noted. IV catheter discontinued intact. Site without signs and symptoms of complications. Dressing and pressure applied.  An After Visit Summary was printed and given to the patient.  D/c education completed with patient/family including follow up instructions, medication list, d/c activities limitations if indicated, with other d/c instructions as indicated by MD - patient able to verbalize understanding, all questions fully answered.   Patient instructed to return to ED, call 911, or call MD for any changes in condition.   Patient escorted via WC, and D/C Behavioral Health via private auto.  Beckey DowningFlores, Cesareo Vickrey F 03/05/2013 4:29 PM

## 2013-03-05 NOTE — Plan of Care (Signed)
Problem: Phase III Progression Outcomes Goal: Pain controlled on oral analgesia Outcome: Completed/Met Date Met:  03/05/13 Pt has not requested pain medication this shift. Denies pain.

## 2013-03-05 NOTE — Progress Notes (Signed)
Per Trauma PA, no iv access needed for pt. Also, staff should not allow pt's fiance to spend the night due to suicide precautions.

## 2013-03-05 NOTE — Treatment Plan (Signed)
[facility] expressed concern regarding pt's lack of bowel movement X 5 days post abdominal surgery.  Lora Havens reported that internal medicine stated that pt would be d/c'd home if not requiring psych inpt therefore her constipation should not be a concern.  Based on internal medicine's assurance of med clearance despite pt's lack of BM X 5 days post abdominal surgery, [facility] will accept to room 307-02.
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
## 2013-03-05 NOTE — Progress Notes (Signed)
Per pt, states she had a BM.

## 2013-03-05 NOTE — Plan of Care (Signed)
Problem: Discharge Progression Outcomes Goal: Staples/sutures removed Outcome: Not Met (add Reason) Pt staples to be removed in a week.

## 2013-03-05 NOTE — Clinical Social Work Psych Note (Addendum)
3:35pm- Psych CSW was given verbal permission from [facility].  Psych CSW contacted [facility] for transportation.  RN and PA aware.  2:18pm- Psych CSW received notification that pt has been accepted to [facility] to bed 307-2 by Lord,NP.  Pt will be transported by [facility] [phone].  Pt signed voluntary form and Psych CSW faxed to [facility].  Sitter, nurse and charge aware.  Report can be called to [phone]  Full assessment to follow.  1:42pm - Psych CSW was notified that [facility] does not have beds available today and no projection of bed tomorrow.    11:44am- Psych CSW contacted admissions coordinator at [facility] and left message.  Per admissions, no beds available today.  Psych CSW awaiting a return call from [place] to confirm bed capacity.  Psych CSW spoke with [facility] at [facility] who stated they were at capacity today - no bed availability.  Vickii Penna, LCSWA [phone]  Clinical Social Work

## 2013-03-05 NOTE — Progress Notes (Signed)
Per MD pt able to go to [facility] without having had a BM on the unit after surgery.

## 2013-03-05 NOTE — Discharge Instructions (Signed)
[facility]      [facility] [phone]  OPEN ABDOMINAL SURGERY: POST OP INSTRUCTIONS  Always review your discharge instruction sheet given to you by the facility where your surgery was performed.  IF YOU HAVE DISABILITY OR FAMILY LEAVE FORMS, YOU MUST BRING THEM TO THE OFFICE FOR PROCESSING.  PLEASE DO NOT GIVE THEM TO YOUR DOCTOR.  1. A prescription for pain medication may be given to you upon discharge.  Take your pain medication as prescribed, if needed.  If narcotic pain medicine is not needed, then you may take acetaminophen (Tylenol) or ibuprofen (Advil) as needed. 2. Take your usually prescribed medications unless otherwise directed. 3. If you need a refill on your pain medication, please contact your pharmacy. They will contact our office to request authorization.  Prescriptions will not be filled after 5pm or on week-ends. 4. You should follow a light diet the first few days after arrival home, such as soup and crackers, pudding, etc.unless your doctor has advised otherwise. A high-fiber, low fat diet can be resumed as tolerated.   Be sure to include lots of fluids daily. Most patients will experience some swelling and bruising on the chest and neck area.  Ice packs will help.  Swelling and bruising can take several days to resolve 5. Most patients will experience some swelling and bruising in the area of the incision. Ice pack will help. Swelling and bruising can take several days to resolve..  6. It is common to experience some constipation if taking pain medication after surgery.  Increasing fluid intake and taking a stool softener will usually help or prevent this problem from occurring.  A mild laxative (Milk of Magnesia or Miralax) should be taken according to package directions if there are no bowel movements after 48 hours. 7.  You may have steri-strips (small skin tapes) in place directly over the incision.  These strips should be left on the skin for 7-10 days.  If your  surgeon used skin glue on the incision, you may shower in 24 hours.  The glue will flake off over the next 2-3 weeks.  Any sutures or staples will be removed at the office during your follow-up visit. You may find that a light gauze bandage over your incision may keep your staples from being rubbed or pulled. You may shower and replace the bandage daily. 8. ACTIVITIES:  You may resume regular (light) daily activities beginning the next day--such as daily self-care, walking, climbing stairs--gradually increasing activities as tolerated.  You may have sexual intercourse when it is comfortable.  Refrain from any heavy lifting or straining until approved by your doctor. a. You may drive when you no longer are taking prescription pain medication, you can comfortably wear a seatbelt, and you can safely maneuver your car and apply brakes b. Return to Work: ___________________________________ 9. You should see your doctor in the office for a follow-up appointment approximately two weeks after your surgery.  Make sure that you call for this appointment within a day or two after you arrive home to insure a convenient appointment time. OTHER INSTRUCTIONS:  _____________________________________________________________ _____________________________________________________________  WHEN TO CALL YOUR DOCTOR: 1. Fever over 101.0 2. Inability to urinate 3. Nausea and/or vomiting 4. Extreme swelling or bruising 5. Continued bleeding from incision. 6. Increased pain, redness, or drainage from the incision. 7. Difficulty swallowing or breathing 8. Muscle cramping or spasms. 9. Numbness or tingling in hands or feet or around lips.  The clinic staff is available to   answer your questions during regular business hours.  Please don't hesitate to call and ask to speak to one of the nurses if you have concerns.  For further questions, please visit [email or web address]   
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
## 2013-03-05 NOTE — Progress Notes (Signed)
Patient examined and I agree with the assessment and plan Will check with [facility] CSW and see about admission. Violeta Gelinas, MD, MPH, FACS Pager: [phone]  03/05/2013 11:54 AM

## 2013-03-05 NOTE — Progress Notes (Signed)
D: Patient resting in bed with eyes closed.  Respirations even and unlabored.  Patient appears to be in no apparent distress. A: Staff to monitor Q 15 mins for safety.   R:Patient remains safe on the unit.  

## 2013-03-05 NOTE — Discharge Summary (Signed)
[name], MD, MPH, FACS Pager: [phone]  

## 2013-03-05 NOTE — Progress Notes (Signed)
Adult Psychoeducational Group Note  Date:  03/05/2013 Time:  8:00 PM  Group Topic/Focus:  AA  Participation Level:  Active  Participation Quality:  Appropriate and Attentive  Affect:  Blunted and Flat  Cognitive:  Alert and Appropriate  Insight: Appropriate and Good  Engagement in Group:  Engaged  Modes of Intervention:  Discussion, Education and Support  Additional Comments:    Humberto Seals Monique 03/05/2013, 11:26 PM

## 2013-03-06 ENCOUNTER — Encounter (HOSPITAL_COMMUNITY): Payer: Self-pay | Admitting: Psychiatry

## 2013-03-06 DIAGNOSIS — F1120 Opioid dependence, uncomplicated: Secondary | ICD-10-CM | POA: Diagnosis present

## 2013-03-06 DIAGNOSIS — F1994 Other psychoactive substance use, unspecified with psychoactive substance-induced mood disorder: Secondary | ICD-10-CM

## 2013-03-06 MED ORDER — SULFAMETHOXAZOLE-TMP DS 800-160 MG PO TABS
1.0000 | ORAL_TABLET | Freq: Two times a day (BID) | ORAL | Status: DC
  Administered 2013-03-06 – 2013-03-08 (×5): 1 via ORAL
  Filled 2013-03-06 (×2): qty 1
  Filled 2013-03-06: qty 11
  Filled 2013-03-06 (×6): qty 1
  Filled 2013-03-06: qty 11

## 2013-03-06 MED ORDER — BACITRACIN-NEOMYCIN-POLYMYXIN 400-5-5000 EX OINT
TOPICAL_OINTMENT | CUTANEOUS | Status: AC
  Filled 2013-03-06: qty 1

## 2013-03-06 MED ORDER — BUSPIRONE HCL 15 MG PO TABS: 15.0000 mg | ORAL_TABLET | Freq: Two times a day (BID) | ORAL | Status: DC

## 2013-03-06 MED ORDER — FLUOXETINE HCL 20 MG PO CAPS: 20.0000 mg | ORAL_CAPSULE | Freq: Every day | ORAL | Status: DC

## 2013-03-06 MED ORDER — BACITRACIN-NEOMYCIN-POLYMYXIN OINTMENT TUBE
TOPICAL_OINTMENT | Freq: Three times a day (TID) | CUTANEOUS | Status: DC
  Administered 2013-03-06: 19:00:00 via TOPICAL
  Administered 2013-03-07 – 2013-03-08 (×4): 15 via TOPICAL
  Filled 2013-03-06: qty 15

## 2013-03-06 MED ORDER — BUSPIRONE HCL 15 MG PO TABS
15.0000 mg | ORAL_TABLET | Freq: Two times a day (BID) | ORAL | Status: DC
  Administered 2013-03-07 – 2013-03-08 (×3): 15 mg via ORAL
  Filled 2013-03-06 (×3): qty 1
  Filled 2013-03-06: qty 8
  Filled 2013-03-06: qty 1
  Filled 2013-03-06: qty 8
  Filled 2013-03-06: qty 1

## 2013-03-06 MED ORDER — FERROUS SULFATE 325 (65 FE) MG PO TABS
325.0000 mg | ORAL_TABLET | Freq: Two times a day (BID) | ORAL | Status: DC
  Administered 2013-03-06 – 2013-03-08 (×4): 325 mg via ORAL
  Filled 2013-03-06 (×6): qty 1

## 2013-03-06 MED ORDER — FLUOXETINE HCL 20 MG PO CAPS
40.0000 mg | ORAL_CAPSULE | Freq: Every day | ORAL | Status: DC
  Administered 2013-03-07 – 2013-03-08 (×2): 40 mg via ORAL
  Filled 2013-03-06 (×3): qty 2
  Filled 2013-03-06: qty 8
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
## 2013-03-06 NOTE — Progress Notes (Signed)
NUTRITION ASSESSMENT  Pt identified as at risk on the Malnutrition Screen Tool  INTERVENTION: 1. Educated patient on the importance of nutrition and encouraged intake of food and beverages. 2. Discussed weight goals. 3. Supplements:   Assessment:  Patient admitted s/p OD on "tripple C's" and SA requiring surgical repair.  Recent bipolar dx per patient.  Patient reports good intake currently and prior to admit and denied weight loss and reports weight gain.  23 y.o. female  Height: Ht Readings from Last 1 Encounters:  03/05/13 5' 3.78" (1.62 m)    Weight: Wt Readings from Last 1 Encounters:  03/05/13 145 lb (65.772 kg)    Weight Hx: Wt Readings from Last 10 Encounters:  03/05/13 145 lb (65.772 kg)  02/28/13 130 lb (58.968 kg)  02/28/13 130 lb (58.968 kg)    BMI:  Body mass index is 25.06 kg/(m^2). Pt meets criteria for overweight based on current BMI.  Estimated Nutritional Needs: Kcal: 25-30 kcal/kg Protein: > 1 gram protein/kg Fluid: 1 ml/kcal  Diet Order: General Pt is also offered choice of unit snacks mid-morning and mid-afternoon.  Pt is eating as desired.   Lab results and medications reviewed.   Oran Rein, RD, LDN

## 2013-03-06 NOTE — [facility] Group Notes (Signed)
[facility] LCSW Aftercare Discharge Planning Group Note   03/06/2013  8:45 AM  Participation Quality:  Did Not Attend - pt sleeping in her room  Reyes Ivan, LCSW 03/06/2013 9:49 AM

## 2013-03-06 NOTE — Progress Notes (Addendum)
D: Patient in the hallway on approach.  Patient states she had a good day but states her abdominal incision has been sore.  Patient states she is not here for drugs or alcohol use she states she is here due to her suicidal gesture while intoxicated.  Patient states she feels better and is ready to go home.  Patient appears to be minimizing her situation and appears guarded.  Patient denies SI/HI and denies AVH. A: Staff to monitor Q 15 mins for safety.  Encouragement and support offered.  Scheduled medications administered per orders.   R: Patient remains safe on the unit.  Patient attended group tonight.  Patient visible on the unit and interacting with peers.  Patient taking administered medications.

## 2013-03-06 NOTE — [facility] Group Notes (Signed)
[facility] LCSW Group Therapy  03/06/2013  1:15 PM   Type of Therapy:  Group Therapy  Participation Level:  Active  Participation Quality:  Attentive, Sharing and Supportive  Affect:  Calm  Cognitive:  Alert and Oriented  Insight:  Developing/Improving and Engaged  Engagement in Therapy:  Developing/Improving and Engaged  Modes of Intervention:  Clarification, Confrontation, Discussion, Education, Exploration, Limit-setting, Orientation, Problem-solving, Rapport Building, Dance movement psychotherapisteality Testing, Socialization and Support  Summary of Progress/Problems: The topic for group today was emotional regulation.  This group focused on both positive and negative emotion identification and allowed group members to process ways to identify feelings, regulate negative emotions, and find healthy ways to manage internal/external emotions. Group members were asked to reflect on a time when their reaction to an emotion led to a negative outcome and explored how alternative responses using emotion regulation would have benefited them. Group members were also asked to discuss a time when emotion regulation was utilized when a negative emotion was experienced.  Pt was able to process her reason for admitting the hospital, sharing the incident of getting upset with her fiance, overdosing and stabbing herself.  Pt was able to process that she was under the influence of alcohol which led to her bad decisions and reaction to her emotions to begin with.  Pt states that she could've talked to her fiance or walked away instead of reacting.  Pt actively participated and was engaged in group discussion.    Isabella IvanChelsea Horton, LCSW 03/06/2013 2:19 PM

## 2013-03-06 NOTE — [facility] Suicide Risk Assessment (Signed)
[facility] INPATIENT:  Family/Significant Other Suicide Prevention Education  Suicide Prevention Education:  Education Completed; Ginger Boal - mother [phone]),  (name of family member/significant other) has been identified by the patient as the family member/significant other with whom the patient will be residing, and identified as the person(s) who will aid the patient in the event of a mental health crisis (suicidal ideations/suicide attempt).  With written consent from the patient, the family member/significant other has been provided the following suicide prevention education, prior to the and/or following the discharge of the patient.  The suicide prevention education provided includes the following:  Suicide risk factors  Suicide prevention and interventions  National Suicide Hotline telephone number  [facility] assessment telephone number  [facility] Emergency Assistance 911  [facility] and/or Residential Mobile Crisis Unit telephone number  Request made of family/significant other to:  Remove weapons (e.g., guns, rifles, knives), all items previously/currently identified as safety concern.    Remove drugs/medications (over-the-counter, prescriptions, illicit drugs), all items previously/currently identified as a safety concern.  The family member/significant other verbalizes understanding of the suicide prevention education information provided.  The family member/significant other agrees to remove the items of safety concern listed above. Mother states that this is the first time pt intentionally overdosed.  Mother states that she spoke with pt today and feels pt is back to her old self and is doing better.  Mother states that she isn't concerned for pt's safety when she returns home, but has the rule in the home, still in place, that pt is not allowed to use.    Isabella Phillips, Isabella Phillips 03/06/2013, 3:06 PM

## 2013-03-06 NOTE — Tx Team (Signed)
Interdisciplinary Treatment Plan Update (Adult)  Date: 03/06/2013  Time Reviewed:  9:45 AM  Progress in Treatment: Attending groups: Yes Participating in groups:  Yes Taking medication as prescribed:  Yes Tolerating medication:  Yes Family/Significant othe contact made: CSW assessing Patient understands diagnosis:  Yes Discussing patient identified problems/goals with staff:  Yes Medical problems stabilized or resolved:  Yes Denies suicidal/homicidal ideation: Yes Issues/concerns per patient self-inventory:  Yes Other:  New problem(s) identified: N/A  Discharge Plan or Barriers: CSW assessing for appropriate referrals.    Reason for Continuation of Hospitalization: Anxiety Depression Medication Stabilization  Comments: N/A  Estimated length of stay: 3-5 days  For review of initial/current patient goals, please see plan of care.  Attendees: Patient:     Family:     Physician:  Dr. Lugo 03/06/2013 10:46 AM   Nursing:   Britney Tyson, RN 03/06/2013 10:46 AM   Clinical Social Worker:  Courtney Fenlon Horton, LCSW 03/06/2013 10:46 AM   Other: Jennifer Clark, RN case manager 03/06/2013 10:46 AM   Other:  Heather Smart, LCSWA 03/06/2013 10:46 AM   Other:  Aggie Nwoko, NP 03/06/2013 10:46 AM   Other:  Donna Shimp, RN 03/06/2013 10:46 AM   Other: Patrice White, RN 03/06/2013 10:46 AM   Other:    Other:    Other:    Other:    Other:     Scribe for Treatment Team:   Horton, Shawan Tosh Nicole, 03/06/2013 , 10:46 AM       
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
## 2013-03-06 NOTE — Progress Notes (Signed)
D: Pt presents with flat affect, depressed mood and appears to be minimizes her problems. Pt denies feeling SI and stated that she have no prior SI attempts. Pt denies having any withdrawal symptoms, per pt she have been clean from heroin for 60 days and she occasionally drinks alcohol. Pt compliant with taking meds and attending groups. Pt mother called this morning and voiced concerns about pt tx plan. Pt was started on meds for UTI and decreased Hgb. No complaints verbalized by pt this morning. A: Medications administered as ordered per MD. Verbal support given. Pt encouraged to attend groups. 15 minute checks performed for safety. R: Pt safety maintained at this time.
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
## 2013-03-06 NOTE — Progress Notes (Signed)
Recreation Therapy Notes  Date: 02.25.2015  Time: 2:45pm Location: [address] Dayroom   Group Topic: Understanding mental health   Goal Area(s) Addresses:  Patient will identify benefits to understanding mental health. Patient will work effectively with teammates.   Behavioral Response: Engaged, Attentive  Intervention: Game  Activity: Mental Health Jeopardy. Patients were divided into groups of 4 -5 patients, working as a team patients were asked to select a category of questions and a point value. Categories included: Symptoms, Medications, Causes, Coping Skills, and Substance Abuse.   Education: Wellness, Discharge Planning   Education Outcome: Acknowledges understanding  Clinical Observations/Feedback: Patient actively engaged in group activity, working well with peers. Patient assisted teammates with answering questions. Of particular interest to group, including patient was the genetic link between substance abuse and mental illness. Patient additionally expressed interest in a pre-disposition to having an addiction. Patient listened attentively as other group members shared stories and asked questions.     Marykay Lex Ludie Pavlik, LRT/CTRS  Jearl Klinefelter 03/06/2013 4:31 PM

## 2013-03-07 MED ORDER — NAPROXEN 500 MG PO TABS: 500.0000 mg | ORAL_TABLET | Freq: Two times a day (BID) | ORAL | Status: DC

## 2013-03-07 MED ORDER — POLYETHYLENE GLYCOL 3350 17 G PO PACK: 17.0000 g | PACK | Freq: Every day | ORAL | Status: DC

## 2013-03-07 MED ORDER — FERROUS SULFATE 325 (65 FE) MG PO TABS: 325.0000 mg | ORAL_TABLET | Freq: Two times a day (BID) | ORAL | Status: AC

## 2013-03-07 MED ORDER — FLUOXETINE HCL 40 MG PO CAPS: 40.0000 mg | ORAL_CAPSULE | Freq: Every day | ORAL | Status: AC

## 2013-03-07 MED ORDER — SULFAMETHOXAZOLE-TMP DS 800-160 MG PO TABS: 1.0000 | ORAL_TABLET | Freq: Two times a day (BID) | ORAL | Status: DC

## 2013-03-07 MED ORDER — BACITRACIN-NEOMYCIN-POLYMYXIN OINTMENT TUBE: 1.0000 "application " | TOPICAL_OINTMENT | Freq: Three times a day (TID) | CUTANEOUS | Status: AC

## 2013-03-07 MED ORDER — DSS 100 MG PO CAPS: 100.0000 mg | ORAL_CAPSULE | Freq: Two times a day (BID) | ORAL | Status: DC

## 2013-03-07 MED ORDER — BUSPIRONE HCL 15 MG PO TABS: 15.0000 mg | ORAL_TABLET | Freq: Two times a day (BID) | ORAL | Status: AC

## 2013-03-07 MED ORDER — HYDROXYZINE HCL 25 MG PO TABS: ORAL_TABLET | ORAL | Status: DC

## 2013-03-07 MED ORDER — TRAZODONE HCL 50 MG PO TABS: 50.0000 mg | ORAL_TABLET | Freq: Every evening | ORAL | Status: DC | PRN

## 2013-03-07 MED ORDER — BISACODYL 10 MG RE SUPP: 10.0000 mg | Freq: Every day | RECTAL | Status: DC | PRN

## 2013-03-07 NOTE — Progress Notes (Signed)
[facility]  03/07/2013 3:35 PM Isabella OchoaBrooke Elizabeth Phillips  MRN:  [id number] Subjective:  Isabella Phillips endorses that her fiancee came by and they were able to talk about what happened. She admits that she reacted based on past experiences with previous relationships. She also admits that if she would not have been drinking she could have been more rational less impulsive. She is having a flare up of her Chron's. States she has pain. Endorses that this is the pain she is used to (afebrile, normal HR) she is looking forward to going home tomorrow. States that she knows her family is going to be keeping an eye on her and she is OK with it.  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   HENT: Negative.   Eyes: Negative.   Respiratory: Negative.   Cardiovascular: Negative.   Gastrointestinal: Positive for abdominal pain.  Genitourinary: Negative.   Musculoskeletal: Negative.   Skin: Negative.   Neurological: Negative.   Endo/Heme/Allergies: Negative.   Psychiatric/Behavioral: Positive for substance abuse. The patient is nervous/anxious.     Blood pressure 116/81, pulse 82, temperature 97.4 F (36.3 C), temperature source Oral, resp. rate 17, height 5' 3.78" (1.62 m), weight 65.772 kg (145 lb), last menstrual period 03/03/2013, SpO2  100.00%.Body mass index is 25.06 kg/(m^2).  General Appearance: Fairly Groomed  Patent attorneyye Contact::  Fair  Speech:  Clear and Coherent and not spontaneous  Volume:  Decreased  Mood:  Euthymic  Affect:  Restricted  Thought Process:  Coherent and Goal Directed  Orientation:  Full (Time, Place, and Person)  Thought Content:  Chron's pain, events, plans to cope  Suicidal Thoughts:  No  Homicidal Thoughts:  No  Memory:  Immediate;   Fair Recent;   Fair Remote;   Fair  Judgement:  Fair  Insight:  Present  Psychomotor Activity:  Decreased  Concentration:  Fair  Recall:  FiservFair  Fund of Knowledge:Fair  Language: Fair  Akathisia:  No  Handed:    AIMS (if indicated):     Assets:  Desire for Improvement  Sleep:  Number of Hours: 6.25   Musculoskeletal: Strength & Muscle Tone: within normal limits Gait & Station: normal Patient leans: N/A  Current Medications: Current Facility-Administered Medications  Medication Dose Route Frequency Provider Last Rate Last Dose  . acetaminophen (TYLENOL) tablet 650 mg  650 mg Oral Q6H PRN Kerry HoughSpencer E Simon, PA-C      . alum & mag hydroxide-simeth (MAALOX/MYLANTA) 200-200-20 MG/5ML suspension 30 mL  30 mL Oral Q4H PRN Kerry HoughSpencer E Simon, PA-C      . bisacodyl (DULCOLAX) suppository 10 mg  10 mg Rectal Daily PRN Kerry HoughSpencer E Simon, PA-C      . busPIRone (BUSPAR) tablet 10 mg  10 mg Oral TID PRN Isabella FeeIrving A Carman Auxier, MD      . busPIRone (BUSPAR) tablet 15 mg  15 mg Oral BID Isabella FeeIrving A Kenyata Napier, MD  15 mg at 03/07/13 4098  . docusate sodium (COLACE) capsule 100 mg  100 mg Oral BID Kerry Hough, PA-C   100 mg at 03/06/13 1729  . ferrous sulfate tablet 325 mg  325 mg Oral BID WC Isabella Kava, NP   325 mg at 03/07/13 1191  . FLUoxetine (PROZAC) capsule 40 mg  40 mg Oral Daily Isabella Fee, MD   40 mg at 03/07/13 4782  . hydrOXYzine (ATARAX/VISTARIL) tablet 25 mg  25 mg Oral Q6H PRN Kerry Hough, PA-C      . magnesium hydroxide (MILK OF MAGNESIA) suspension 30 mL  30 mL Oral  Daily PRN Kerry Hough, PA-C      . naproxen (NAPROSYN) tablet 500 mg  500 mg Oral BID WC Kerry Hough, PA-C   500 mg at 03/07/13 9562  . neomycin-bacitracin-polymyxin (NEOSPORIN) ointment   Topical TID Isabella Fanny, FNP   15 application at 03/07/13 1425  . nicotine (NICODERM CQ - dosed in mg/24 hours) patch 14 mg  14 mg Transdermal Daily Isabella Fanny, FNP      . polyethylene glycol (MIRALAX / GLYCOLAX) packet 17 g  17 g Oral Daily Kerry Hough, PA-C   17 g at 03/06/13 1308  . sulfamethoxazole-trimethoprim (BACTRIM DS) 800-160 MG per tablet 1 tablet  1 tablet Oral Q12H Isabella Kava, NP   1 tablet at 03/07/13 0825  . traZODone (DESYREL) tablet 50 mg  50 mg Oral QHS,MR X 1 Kerry Hough, PA-C        Lab Results:  Results for orders placed during the hospital encounter of 03/05/13 (from the past 48 hour(s))  URINE CULTURE     Status: None   Collection Time    03/06/13  6:20 AM      Result Value Ref Range   Specimen Description       Value: URINE, CLEAN CATCH     Performed at [facility]   Special Requests       Value: NONE     Performed at [facility]   Culture  Setup Time       Value: 03/06/2013 08:15     Performed at [facility]   Colony Count PENDING     Culture       Value: Culture reincubated for better growth     Performed at [facility]   Report Status PENDING      Physical Findings: AIMS: Facial and Oral Movements Muscles of Facial Expression: None, normal Lips and Perioral Area: None, normal Jaw: None, normal Tongue: None, normal,Extremity Movements Upper (arms, wrists, hands, fingers): None, normal Lower (legs, knees, ankles, toes): None, normal, Trunk Movements Neck, shoulders, hips: None, normal, Overall Severity Severity of abnormal movements (highest score from questions above): None, normal Incapacitation due to abnormal movements: None, normal Patient's awareness of abnormal movements (rate  only patient's report): No Awareness, Dental Status Current problems with teeth and/or dentures?: No Does patient usually wear dentures?: No  CIWA:  CIWA-Ar Total: 2 COWS:  COWS Total Score: 3  Treatment Plan Summary: Daily contact with patient to assess and evaluate symptoms and progress in treatment Medication management  Plan: Supportive approach/coping skills/relapse prevention           Pursue current medications           CBT;mindfulenss  Medical Decision Making Problem Points:  Review of psycho-social stressors (1) Data Points:  Review of  medication regiment & side effects (2)  I certify that inpatient services furnished can reasonably be expected to improve the patient's condition.   Joannie Medine A 03/07/2013, 3:35 PM

## 2013-03-07 NOTE — [facility] Group Notes (Signed)
[facility] LCSW Group Therapy  03/07/2013  1:15 PM   Type of Therapy:  Group Therapy  Participation Level:  Active  Participation Quality:  Attentive, Sharing and Supportive  Affect: Calm  Cognitive:  Alert and Oriented  Insight:  Developing/Improving and Engaged  Engagement in Therapy:  Developing/Improving and Engaged  Modes of Intervention:  Clarification, Confrontation, Discussion, Education, Exploration, Limit-setting, Orientation, Problem-solving, Rapport Building, Dance movement psychotherapist, Socialization and Support  Summary of Progress/Problems: The topic for group was balance in life.  Today's group focused on defining balance in one's own words, identifying things that can knock one off balance, and exploring healthy ways to maintain balance in life. Group members were asked to provide an example of a time when they felt off balance, describe how they handled that situation,and process healthier ways to regain balance in the future. Group members were asked to share the most important tool for maintaining balance that they learned while at [facility] and how they plan to apply this method after discharge.  Pt states that she began using substance because she felt her life was off balance.  Pt states that she was having a hard time letting go of her past and trusting others.  Pt was insightful in sharing that she needs to begin to trust others and not hold on to what has happened to her in the past.  Pt actively participated and was engaged in group discussion.    Reyes Ivan, LCSW 03/07/2013 2:23 PM

## 2013-03-07 NOTE — Progress Notes (Signed)
[facility] Group Notes:  (Nursing/MHT/Case Management/Adjunct)  Date:  03/07/2013  Time: 2100 Type of Therapy:  wrap up group  Participation Level:  Active  Participation Quality:  Appropriate, Attentive and Sharing  Affect:  Appropriate  Cognitive:  Appropriate  Insight:  Improving  Engagement in Group:  Engaged  Modes of Intervention:  Clarification, Education and Support  Summary of Progress/Problems:  Isabella Phillips 03/07/2013, 11:14 PM

## 2013-03-07 NOTE — [facility] Suicide Risk Assessment (Signed)
Suicide Risk Assessment  Discharge Assessment     Demographic Factors:  Adolescent or young adult, Caucasian and Gay, lesbian, or bisexual orientation  Total Time spent with patient: 45 minutes  Psychiatric Specialty Exam:     Blood pressure 116/81, pulse 82, temperature 97.4 F (36.3 C), temperature source Oral, resp. rate 17, height 5' 3.78" (1.62 m), weight 65.772 kg (145 lb), last menstrual period 03/03/2013, SpO2 100.00%.Body mass index is 25.06 kg/(m^2).  General Appearance: Fairly Groomed  Patent attorneyye Contact::  Fair  Speech:  Clear and Coherent  Volume:  Decreased  Mood:  Euthymic  Affect:  Restricted  Thought Process:  Coherent and Goal Directed  Orientation:  Full (Time, Place, and Person)  Thought Content:  goals for when she goes home, changes she plans to implement  Suicidal Thoughts:  No  Homicidal Thoughts:  No  Memory:  Immediate;   Fair Recent;   Fair Remote;   Fair  Judgement:  Fair  Insight:  Present  Psychomotor Activity:  Normal  Concentration:  Fair  Recall:  F[employer]ervFair  Fund of Knowledge:Fair  Language: Fair  Akathisia:  No  Handed:    AIMS (if indicated):     Assets:  Desire for Improvement Housing Social Support  Sleep:  Number of Hours: 6.25    Musculoskeletal: Strength & Muscle Tone: within normal limits Gait & Station: normal Patient leans: N/A   Mental Status Per Nursing Assessment::   On Admission:  NA  Current Mental Status by Physician: In full contact with reality. Willing and motivated to pursue outpatient therapy to address unresolved issues having to do with past relationships   Loss Factors: Decline in physical health  Historical Factors: NA  Risk Reduction Factors:   Sense of responsibility to family, Living with another person, especially a relative and Positive social support  Continued Clinical Symptoms:  Alcohol/Substance Abuse/Dependencies  Cognitive Features That Contribute To Risk:  Polarized thinking Thought  constriction (tunnel vision)    Suicide Risk:  Minimal: No identifiable suicidal ideation.  Patients presenting with no risk factors but with morbid ruminations; may be classified as minimal risk based on the severity of the depressive symptoms  Discharge Diagnoses:   AXIS I:  Opioid Dependence, S/P acute alcohol intoxication, Substance Induced mood diosorder AXIS II:  No diagnosis AXIS III:   Past Medical History  Diagnosis Date  . Bipolar 1 disorder   . Crohn's disease   . Depression   . Anxiety    AXIS IV:  other psychosocial or environmental problems AXIS V:  61-70 mild symptoms  Plan Of Care/Follow-up recommendations:  Activity:  as tolerated Diet:  regular Follow up outpatient basis Is patient on multiple antipsychotic therapies at discharge:  No   Has Patient had three or more failed trials of antipsychotic monotherapy by history:  No  Recommended Plan for Multiple Antipsychotic Therapies: NA    Kendale Rembold A 03/07/2013, 3:56 PM

## 2013-03-07 NOTE — Progress Notes (Signed)
Recreation Therapy Notes  Date: 02.26.2015 Time: 2:45pm Location: [address] Dayroom   Group Topic: Leisure Education  Goal Area(s) Addresses:  Patient will identify positive leisure activities.  Patient will identify positive emotions associated with leisure participation.  Patient will identify one positive benefit of participation in leisure activities.   Behavioral Response: Engaged, Attentive, Appropriate   Intervention: Game  Activity: LRT selected a letter from a bag, using the selected letter patients were asked to identify as many leisure activities as possible that start with the selected letter in 3 minutes. Patients worked in teams of 4. Last round of game was used to ask patients to identify positive emotions associated with leisure participation.   Education:  Leisure Programme researcher, broadcasting/film/videoducation, Building control surveyorDischarge Planning, PharmacologistCoping Skills   Education Outcome: Acknowledges understanding  Clinical Observations/Feedback: Patient actively engaged in group activity, working well with teammates and identifying words to contribute to team list. Patient made no contributions to group discussion, but appeared to actively listen as she maintained appropriate eye contact with speaker and nodded in agreement with points of interest.   Jearl Klinefelterenise L Tahjay Binion, LRT/CTRS  Jearl KlinefelterBlanchfield, Adriene Padula L 03/07/2013 3:48 PM

## 2013-03-07 NOTE — Progress Notes (Signed)
The focus of this group is to educate the patient on the purpose and policies of crisis stabilization and provide a format to answer questions about their admission.  The group details unit policies and expectations of patients while admitted.  Patient did not attend 0900 nurse education orientation group this morning.  Patient returned to bed after morning medications.
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
## 2013-03-07 NOTE — Progress Notes (Signed)
Did attend group 

## 2013-03-07 NOTE — Progress Notes (Addendum)
D: Patient in the dayroom on first approach.  Patient states she had a good day.  Patient states she is looking forward to discharge tomorrow.  Patient states she is going to attend NA meetings with her fiance so she can stay clean.  Patient denies SI/HI and denies AVH.   A: Staff to monitor Q 15 mins for safety.  Encouragement and support offered.  Scheduled medications administered per orders. R: Patient remains safe on the unit.  Patient attended group tonight.  Patient visible on the unit.  Patient taking administered medications.

## 2013-03-08 LAB — URINE CULTURE: Colony Count: 35000

## 2013-03-08 NOTE — BHH Group Notes (Signed)
[facility] LCSW Aftercare Discharge Planning Group Note   03/08/2013 8:45 AM  Participation Quality:  Alert, Appropriate and Oriented  Mood/Affect:  Bright  Depression Rating:  0  Anxiety Rating:  0  Thoughts of Suicide:  Pt denies SI/HI  Will you contract for safety?   Yes  Current AVH:  Pt denies  Plan for Discharge/Comments:  Pt attended discharge planning group and actively participated in group.  CSW provided pt with today's workbook.  Pt reports feeling stable to d/c today.  Pt will return home and has follow up scheduled at [facility] and [facility] for medication management and therapy.  No further needs voiced by pt at this time.    Transportation Means: Pt reports access to transportation - mom will pick pt up  Supports: No supports mentioned at this time  Reyes Ivan, LCSW 03/08/2013 9:47 AM

## 2013-03-08 NOTE — Tx Team (Signed)
Interdisciplinary Treatment Plan Update (Adult)  Date: 03/08/2013  Time Reviewed:  9:45 AM  Progress in Treatment: Attending groups: Yes Participating in groups:  Yes Taking medication as prescribed:  Yes Tolerating medication:  Yes Family/Significant othe contact made: Yes, with pt's mother Patient understands diagnosis:  Yes Discussing patient identified problems/goals with staff:  Yes Medical problems stabilized or resolved:  Yes Denies suicidal/homicidal ideation: Yes Issues/concerns per patient self-inventory:  Yes Other:  New problem(s) identified: N/A  Discharge Plan or Barriers: Pt will [facility] up at [facility] for medication management and therapy.   Reason for Continuation of Hospitalization: Stable to d/c today  Comments: N/A  Estimated length of stay: D/C today  For review of initial/current patient goals, please see plan of care.  Attendees: Patient:     Family:     Physician:   03/08/2013 10:28 AM   Nursing:    03/08/2013 10:28 AM   Clinical Social Worker:  Reyes Ivan, LCSW 03/08/2013 10:28 AM   Other: Onnie Boer, RN case manager 03/08/2013 10:28 AM   Other:  Trula Slade, LCSWA 03/08/2013 10:28 AM   Other:  Serena Colonel, NP 03/08/2013 10:29 AM   Other:  Shelda Jakes, RN 03/08/2013 10:29 AM   Other:    Other:    Other:    Other:    Other:    Other:     Scribe for Treatment Team:   Carmina Miller, 03/08/2013 , 10:28 AM

## 2013-03-08 NOTE — Progress Notes (Signed)
D/C instructions/meds/follow-up appointments reviewed, pt verbalized understanding, pt's belongings returned to pt, samples given. 

## 2013-03-08 NOTE — Progress Notes (Signed)
[facility] Adult Case Management Discharge Plan :  Will you be returning to the same living situation after discharge: Yes,  returning home At discharge, do you have transportation home?:Yes,  mom will pick pt up Do you have the ability to pay for your medications:Yes,  provided pt with prescriptions and pt verbalizes ability to afford meds  Release of information consent forms completed and in the chart;  Patient's signature needed at discharge.  Patient to Follow up at: Follow-up Information   Follow up with [facility] Dept. of Psychiatry On 03/14/2013. (Appointment scheduled at 1:30 pm on this date with Dr. Janee Morn for medication management.)    Contact information:   [address] [place], [place] [address] Phone: [phone] Fax: [phone]      Follow up with [facility] On 03/14/2013. (Appointment scheduled at 2:00 pm on this date with Adrianne Ryke for therapy)    Contact information:   [address] [place], [place] [address] Phone: [phone] Fax: [phone]      Patient denies SI/HI:   Yes,  denies SI/HI    Safety Planning and Suicide Prevention discussed:  Yes,  discussed with pt and pt's mother.  See suicide prevention education note.   Isabella Phillips 03/08/2013, 10:26 AM

## 2013-03-13 ENCOUNTER — Encounter (INDEPENDENT_AMBULATORY_CARE_PROVIDER_SITE_OTHER): Payer: PRIVATE HEALTH INSURANCE

## 2013-03-13 NOTE — Progress Notes (Signed)
Patient Discharge Instructions:  After Visit Summary (AVS):   Faxed to:  03/13/13 Discharge Summary Note:   Faxed to:  03/13/13 Psychiatric Admission Assessment Note:   Faxed to:  03/13/13 Suicide Risk Assessment - Discharge Assessment:   Faxed to:  03/13/13 Faxed/Sent to the Next Level Care provider:  03/13/13 Faxed to [facility] @ [phone] Faxed to [facility] Dept of Psychiatry @[phone]  Jerelene Redden, 03/13/2013, 3:13 PM

## 2013-03-15 ENCOUNTER — Ambulatory Visit (INDEPENDENT_AMBULATORY_CARE_PROVIDER_SITE_OTHER): Payer: PRIVATE HEALTH INSURANCE | Admitting: General Surgery

## 2013-03-15 ENCOUNTER — Encounter (INDEPENDENT_AMBULATORY_CARE_PROVIDER_SITE_OTHER): Payer: Self-pay | Admitting: General Surgery

## 2013-03-15 VITALS — BP 126/80 | HR 77 | Temp 98.4°F | Resp 16 | Ht 62.0 in | Wt 148.0 lb

## 2013-03-15 DIAGNOSIS — S31119A Laceration without foreign body of abdominal wall, unspecified quadrant without penetration into peritoneal cavity, initial encounter: Secondary | ICD-10-CM

## 2013-03-15 DIAGNOSIS — S31109A Unspecified open wound of abdominal wall, unspecified quadrant without penetration into peritoneal cavity, initial encounter: Secondary | ICD-10-CM

## 2013-03-15 NOTE — Patient Instructions (Signed)
Follow up as needed.  OK to shower, no baths/pool for another 2-4 weeks.  No heavy lifting for another 6 weeks.

## 2013-03-15 NOTE — Assessment & Plan Note (Signed)
Pt clinically well.  She was advised on lifting restrictions and pool restrictions.  Follow up as needed.
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
